# Patient Record
Sex: Female | Born: 1985 | Race: Black or African American | Hispanic: No | Marital: Single | Smoking: Current some day smoker
Health system: Southern US, Community
[De-identification: ages and names within clinical notes are randomized; demographics above are authoritative.]

## PROBLEM LIST (undated history)

## (undated) DIAGNOSIS — G459 Transient cerebral ischemic attack, unspecified: Secondary | ICD-10-CM

## (undated) DIAGNOSIS — F419 Anxiety disorder, unspecified: Secondary | ICD-10-CM

---

## 2017-01-22 ENCOUNTER — Emergency Department
Admission: EM | Admit: 2017-01-22 | Discharge: 2017-01-22 | Disposition: A | Discharge status: Home / Self Care | Payer: No Typology Code available for payment source | Attending: Emergency Medicine | Admitting: Emergency Medicine

## 2017-01-22 ENCOUNTER — Encounter: Payer: Self-pay | Admitting: Emergency Medicine

## 2017-01-22 ENCOUNTER — Emergency Department: Payer: No Typology Code available for payment source

## 2017-01-22 DIAGNOSIS — S39012A Strain of muscle, fascia and tendon of lower back, initial encounter: Secondary | ICD-10-CM

## 2017-01-22 DIAGNOSIS — Y999 Unspecified external cause status: Secondary | ICD-10-CM | POA: Diagnosis not present

## 2017-01-22 DIAGNOSIS — R109 Unspecified abdominal pain: Secondary | ICD-10-CM | POA: Diagnosis not present

## 2017-01-22 DIAGNOSIS — Y939 Activity, unspecified: Secondary | ICD-10-CM | POA: Insufficient documentation

## 2017-01-22 DIAGNOSIS — S3992XA Unspecified injury of lower back, initial encounter: Secondary | ICD-10-CM | POA: Diagnosis present

## 2017-01-22 DIAGNOSIS — R51 Headache: Secondary | ICD-10-CM | POA: Diagnosis not present

## 2017-01-22 DIAGNOSIS — R079 Chest pain, unspecified: Secondary | ICD-10-CM | POA: Insufficient documentation

## 2017-01-22 DIAGNOSIS — Y92410 Unspecified street and highway as the place of occurrence of the external cause: Secondary | ICD-10-CM | POA: Insufficient documentation

## 2017-01-22 DIAGNOSIS — F17200 Nicotine dependence, unspecified, uncomplicated: Secondary | ICD-10-CM | POA: Diagnosis not present

## 2017-01-22 DIAGNOSIS — S161XXA Strain of muscle, fascia and tendon at neck level, initial encounter: Secondary | ICD-10-CM | POA: Insufficient documentation

## 2017-01-22 HISTORY — DX: Anxiety disorder, unspecified: F41.9

## 2017-01-22 HISTORY — DX: Transient cerebral ischemic attack, unspecified: G45.9

## 2017-01-22 LAB — COMPREHENSIVE METABOLIC PANEL
ALBUMIN: 4.5 g/dL (ref 3.5–5.0)
ALT: 19 U/L (ref 14–54)
ANION GAP: 9 (ref 5–15)
AST: 26 U/L (ref 15–41)
Alkaline Phosphatase: 54 U/L (ref 38–126)
BUN: 11 mg/dL (ref 6–20)
CO2: 24 mmol/L (ref 22–32)
Calcium: 9 mg/dL (ref 8.9–10.3)
Chloride: 107 mmol/L (ref 101–111)
Creatinine, Ser: 0.68 mg/dL (ref 0.44–1.00)
GFR calc Af Amer: >60 mL/min (ref >60)
GFR calc non Af Amer: >60 mL/min (ref >60)
GLUCOSE: 95 mg/dL (ref 65–99)
POTASSIUM: 3.4 mmol/L — AB (ref 3.5–5.1)
SODIUM: 140 mmol/L (ref 135–145)
Total Bilirubin: 0.4 mg/dL (ref 0.3–1.2)
Total Protein: 7.8 g/dL (ref 6.5–8.1)

## 2017-01-22 LAB — CBC WITH DIFFERENTIAL/PLATELET
BASOS ABS: 0.1 10*3/uL (ref 0–0.1)
BASOS PCT: 1 %
EOS ABS: 0 10*3/uL (ref 0–0.7)
Eosinophils Relative: 0 %
HCT: 38.2 % (ref 35.0–47.0)
Hemoglobin: 13.6 g/dL (ref 12.0–16.0)
Lymphocytes Relative: 22 %
Lymphs Abs: 2 10*3/uL (ref 1.0–3.6)
MCH: 36.4 pg — ABNORMAL HIGH (ref 26.0–34.0)
MCHC: 35.6 g/dL (ref 32.0–36.0)
MCV: 102.2 fL — ABNORMAL HIGH (ref 80.0–100.0)
MONOS PCT: 7 %
Monocytes Absolute: 0.7 10*3/uL (ref 0.2–0.9)
NEUTROS PCT: 70 %
Neutro Abs: 6.6 10*3/uL — ABNORMAL HIGH (ref 1.4–6.5)
Platelets: 292 10*3/uL (ref 150–440)
RBC: 3.74 MIL/uL — ABNORMAL LOW (ref 3.80–5.20)
RDW: 13.3 % (ref 11.5–14.5)
WBC: 9.4 10*3/uL (ref 3.6–11.0)

## 2017-01-22 LAB — POCT PREGNANCY, URINE: Preg Test, Ur: NEGATIVE

## 2017-01-22 MED ORDER — TRAMADOL HCL 50 MG PO TABS: 50.0000 mg | ORAL_TABLET | Freq: Four times a day (QID) | ORAL | 0 refills | Status: AC | PRN

## 2017-01-22 MED ORDER — OXYCODONE-ACETAMINOPHEN 5-325 MG PO TABS
1.0000 | ORAL_TABLET | Freq: Once | ORAL | Status: AC
  Administered 2017-01-22: 1 via ORAL
  Filled 2017-01-22: qty 1

## 2017-01-22 MED ORDER — FENTANYL CITRATE (PF) 100 MCG/2ML IJ SOLN
INTRAMUSCULAR | Status: AC
  Administered 2017-01-22: 50 ug via INTRAVENOUS
  Filled 2017-01-22: qty 2

## 2017-01-22 MED ORDER — FENTANYL CITRATE (PF) 100 MCG/2ML IJ SOLN
50.0000 ug | Freq: Once | INTRAMUSCULAR | Status: AC
  Administered 2017-01-22: 50 ug via INTRAVENOUS

## 2017-01-22 MED ORDER — IOPAMIDOL (ISOVUE-300) INJECTION 61%
100.0000 mL | Freq: Once | INTRAVENOUS | Status: AC | PRN
  Administered 2017-01-22: 100 mL via INTRAVENOUS

## 2017-01-22 MED ORDER — SODIUM CHLORIDE 0.9 % IV BOLUS (SEPSIS)
500.0000 mL | Freq: Once | INTRAVENOUS | Status: AC
  Administered 2017-01-22: 500 mL via INTRAVENOUS
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
